# Patient Record
Sex: Male | Born: 1985 | Race: Black or African American | Hispanic: No | Marital: Single | Smoking: Current every day smoker
Health system: Southern US, Community
[De-identification: ages and names within clinical notes are randomized; demographics above are authoritative.]

## PROBLEM LIST (undated history)

## (undated) ENCOUNTER — Emergency Department (HOSPITAL_COMMUNITY): Admission: EM | Payer: Self-pay | Source: Home / Self Care

## (undated) DIAGNOSIS — B20 Human immunodeficiency virus [HIV] disease: Secondary | ICD-10-CM

## (undated) HISTORY — DX: Human immunodeficiency virus (HIV) disease: B20

---
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
## 2013-04-11 ENCOUNTER — Emergency Department (HOSPITAL_COMMUNITY): Payer: Self-pay

## 2013-04-11 ENCOUNTER — Encounter (HOSPITAL_COMMUNITY): Payer: Self-pay | Admitting: Emergency Medicine

## 2013-04-11 ENCOUNTER — Emergency Department (HOSPITAL_COMMUNITY)
Admission: EM | Admit: 2013-04-11 | Discharge: 2013-04-11 | Disposition: A | Discharge status: Home / Self Care | Payer: Self-pay | Attending: Emergency Medicine | Admitting: Emergency Medicine

## 2013-04-11 DIAGNOSIS — F17200 Nicotine dependence, unspecified, uncomplicated: Secondary | ICD-10-CM | POA: Insufficient documentation

## 2013-04-11 DIAGNOSIS — S63509A Unspecified sprain of unspecified wrist, initial encounter: Secondary | ICD-10-CM | POA: Insufficient documentation

## 2013-04-11 DIAGNOSIS — X500XXA Overexertion from strenuous movement or load, initial encounter: Secondary | ICD-10-CM | POA: Insufficient documentation

## 2013-04-11 DIAGNOSIS — Y929 Unspecified place or not applicable: Secondary | ICD-10-CM | POA: Insufficient documentation

## 2013-04-11 DIAGNOSIS — Y939 Activity, unspecified: Secondary | ICD-10-CM | POA: Insufficient documentation

## 2013-04-11 MED ORDER — NAPROXEN 500 MG PO TABS: 500.0000 mg | ORAL_TABLET | Freq: Two times a day (BID) | ORAL | Status: AC

## 2013-04-11 NOTE — ED Provider Notes (Signed)
Medical screening examination/treatment/procedure(s) were performed by non-physician practitioner and as supervising physician I was immediately available for consultation/collaboration.  EKG Interpretation   None         Kenetha Cozza L Vasili Fok, MD 04/11/13 2210 

## 2013-04-11 NOTE — ED Notes (Signed)
Pt states that he was arrested on 9/19 and has been having left wrist pain from handcuffs.

## 2013-04-11 NOTE — ED Provider Notes (Signed)
CSN: [id number]     Arrival date & time 04/11/13  1727 History   First MD Initiated Contact with Patient 04/11/13 1829    This chart was scribed for Iona Coach, a non-physician practitioner working with Benny Lennert, MD by Lewanda Rife, ED Scribe. This patient was seen in room WTR9/WTR9 and the patient's care was started at 7:05 PM     Chief Complaint  Patient presents with  . Wrist Pain    left   (Consider location/radiation/quality/duration/timing/severity/associated sxs/prior Treatment) The history is provided by the patient. No language interpreter was used.   HPI Comments: Jesus Fitzgerald is a 27 y.o. male who presents to the Emergency Department complaining of constant unchanged left wrist pain onset 9/19 since being handcuffed. Reports associated mild swelling. Reports pain is exacerbated when Jesus Fitzgerald plays with his dog. Denies any alleviating factors. Denies associated recent injuries, falls, numbness, and fever.     History reviewed. No pertinent past medical history. History reviewed. No pertinent past surgical history. No family history on file. History  Substance Use Topics  . Smoking status: Current Every Day Smoker    Types: Cigarettes  . Smokeless tobacco: Never Used  . Alcohol Use: Yes     Comment: occasion    Review of Systems  Musculoskeletal: Positive for arthralgias (left wrist pain from hand cuffs).  All other systems reviewed and are negative.   A complete 10 system review of systems was obtained and all systems are negative except as noted in the HPI and PMHx.    Allergies  Review of patient's allergies indicates no known allergies.  Home Medications  No current outpatient prescriptions on file. BP 143/89  Pulse 74  Temp(Src) 98.1 F (36.7 C) (Oral)  Resp 16  SpO2 100% Physical Exam  Nursing note and vitals reviewed. Constitutional: Jesus Fitzgerald is oriented to person, place, and time. Jesus Fitzgerald appears well-developed and well-nourished. No  distress.  HENT:  Head: Normocephalic and atraumatic.  Eyes: EOM are normal.  Neck: Neck supple. No tracheal deviation present.  Cardiovascular: Normal rate, regular rhythm, intact distal pulses and normal pulses.   Pulses:      Radial pulses are 2+ on the right side, and 2+ on the left side.  Pulmonary/Chest: Effort normal. No respiratory distress.  Musculoskeletal: Normal range of motion.  Left wrist: TTP of radial aspect of left wrist    Pain is flexion and extension  Limited ROM secondary to pain    Neurological: Jesus Fitzgerald is alert and oriented to person, place, and time.  Grip strength is normal   Skin: Skin is warm and dry.  Psychiatric: Jesus Fitzgerald has a normal mood and affect. His behavior is normal.    ED Course  Procedures (including critical care time)  COORDINATION OF CARE:  Nursing notes reviewed. Vital signs reviewed. Initial pt interview and examination performed.   7:06 PM-Discussed work up plan with pt at bedside, which includes left wrist x-ray . Pt agrees with plan.  7:07 PM Nursing Notes Reviewed/ Care Coordinated Applicable Imaging Reviewed and incorporated into ED treatment Discussed results and treatment plan with pt. Pt demonstrates understanding and agrees with plan.   Treatment plan initiated:Medications - No data to display   Initial diagnostic testing ordered.    Labs Review Labs Reviewed - No data to display Imaging Review Dg Wrist Complete Left  04/11/2013   CLINICAL DATA:  Wrist pain, injury.  EXAM: LEFT WRIST - COMPLETE 3+ VIEW  COMPARISON:  None  FINDINGS: There is  no evidence of fracture or dislocation. There is no evidence of arthropathy or other focal bone abnormality. Soft tissues are unremarkable.  IMPRESSION: Negative.   Electronically Signed   By: Charlett Nose M.D.   On: 04/11/2013 18:57    EKG Interpretation   None       MDM   1. Wrist sprain and strain, left, initial encounter     Patient's with left wrist pain and swelling after his  dog pulled on his hand 2 days ago. Prior injury on September 19 after Jesus Fitzgerald had handcuffs on the same wrist. Patient has swelling and tenderness over radial aspect of his left wrist. His x-rays negative. Pain with range of motion strength is intact. Jesus Fitzgerald is neurovascularly intact. Suspect a sprain. Will apply a Velcro wrist splint and followup with orthopedics as needed. NSAIDs for pain.  Filed Vitals:   04/11/13 1747 04/11/13 1928  BP: 143/89 153/101  Pulse: 74 75  Temp: 98.1 F (36.7 C) 98.4 F (36.9 C)  TempSrc: Oral Oral  Resp: 16 12  SpO2: 100% 99%    I personally performed the services described in this documentation, which was scribed in my presence. The recorded information has been reviewed and is accurate.    Lottie Mussel, PA-C 04/11/13 1945

## 2014-03-30 IMAGING — CR DG WRIST COMPLETE 3+V*L*
4 series · 4 of 4 positions shown · non-contrast
Comparison: None

CLINICAL DATA: Wrist pain, injury.

EXAM:
LEFT WRIST - COMPLETE 3+ VIEW

[x wrist pa left]
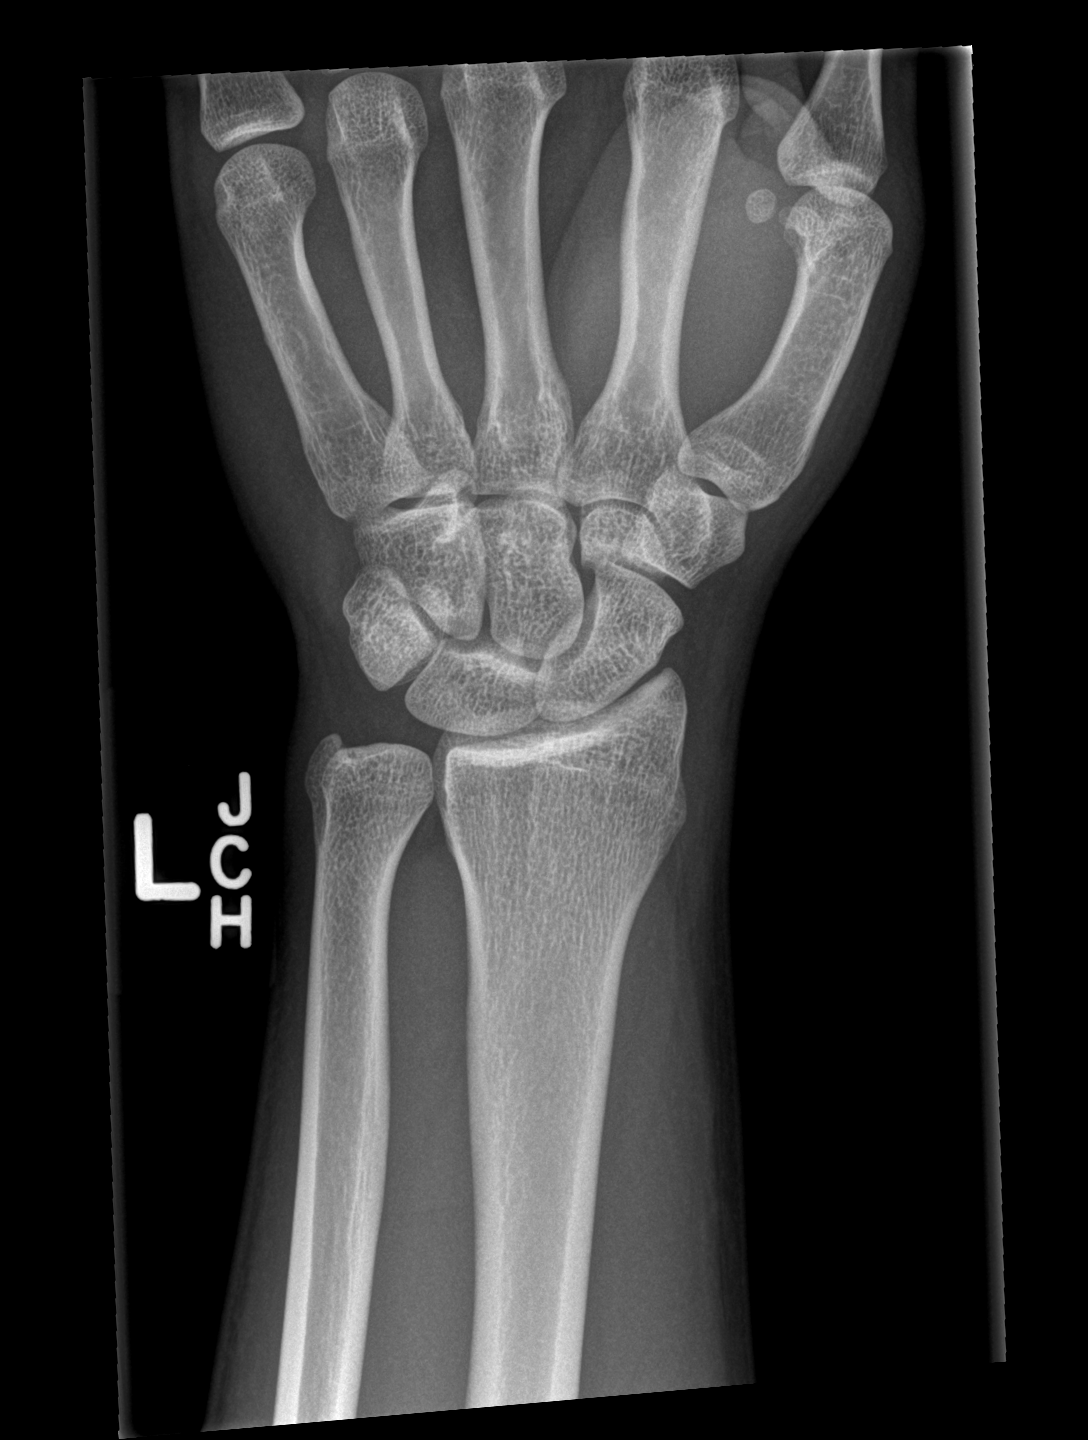

[x wrist obl left]
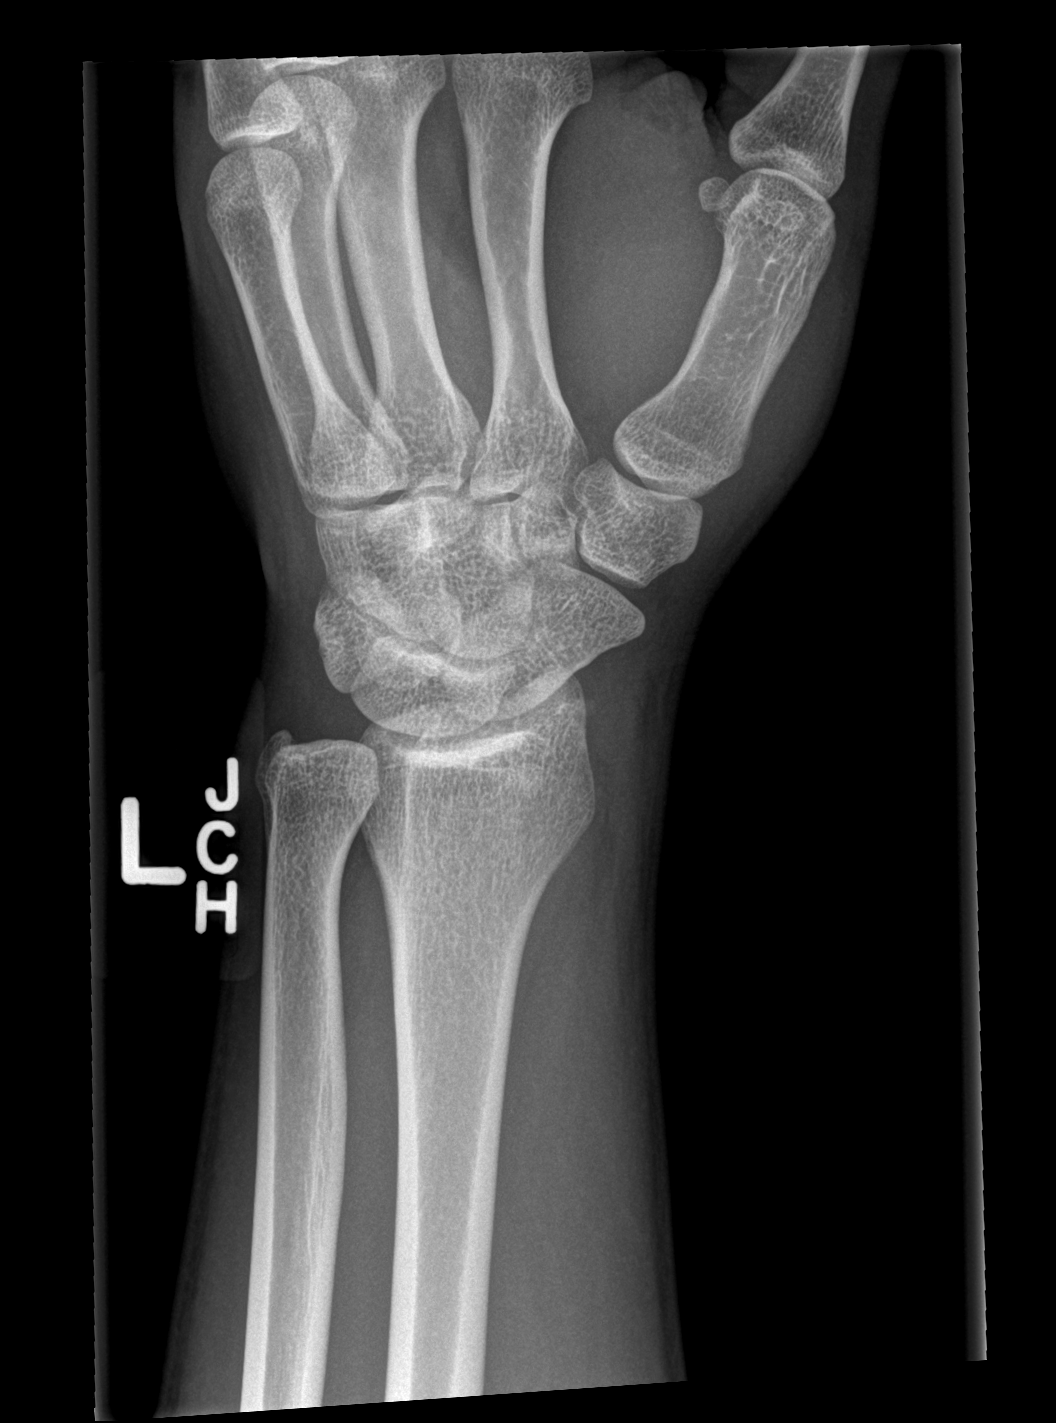

[x wrist lat left]
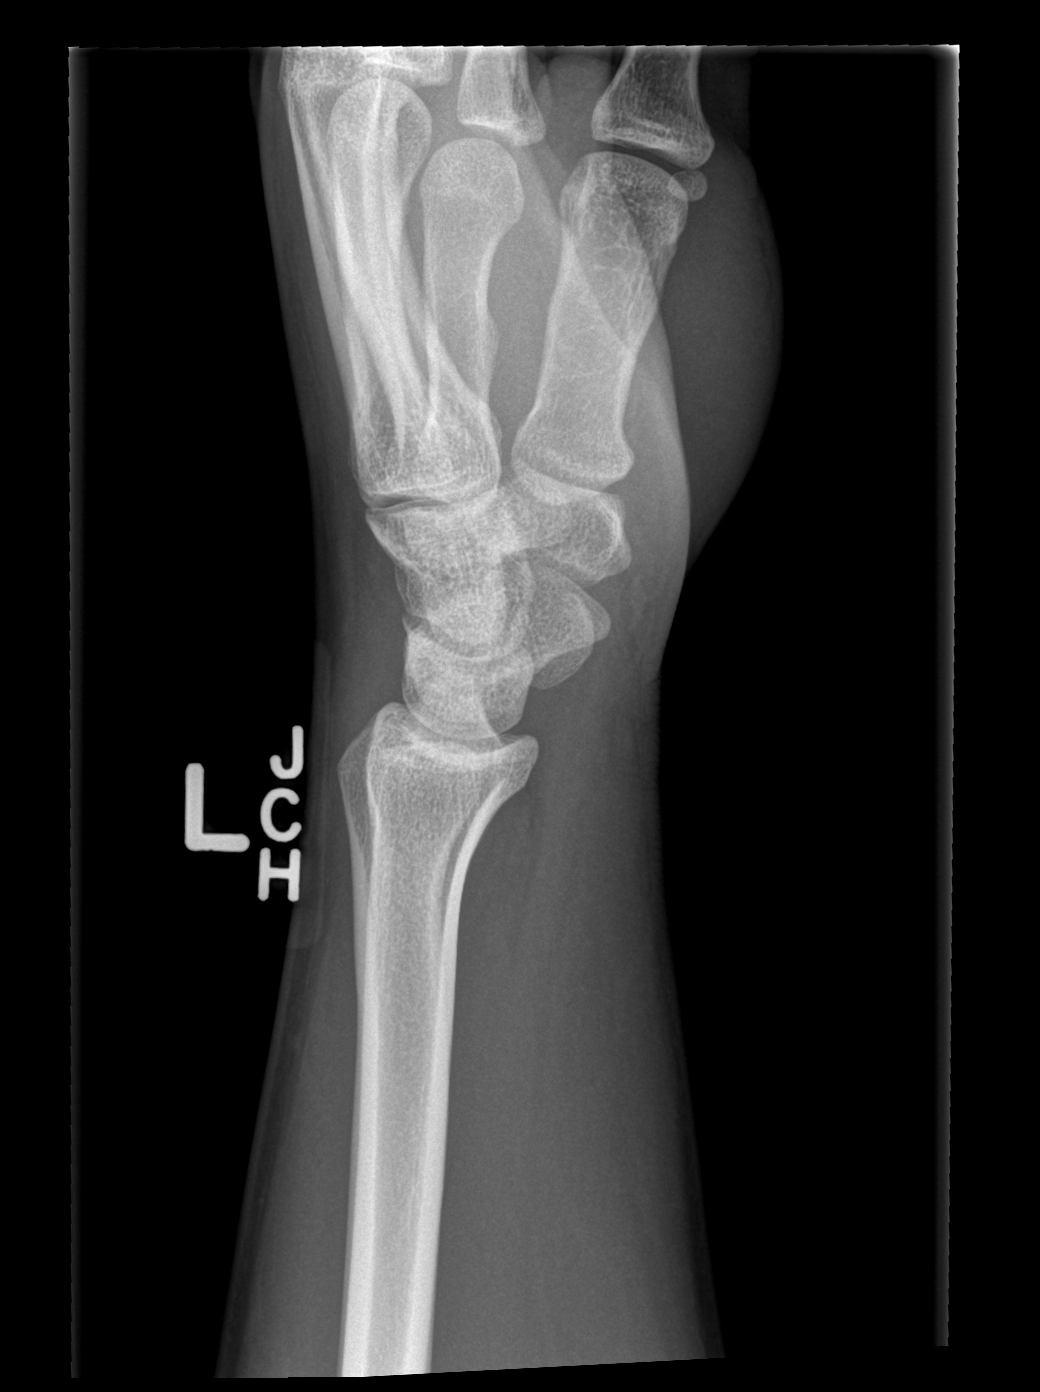

[x wrist navicular view left]
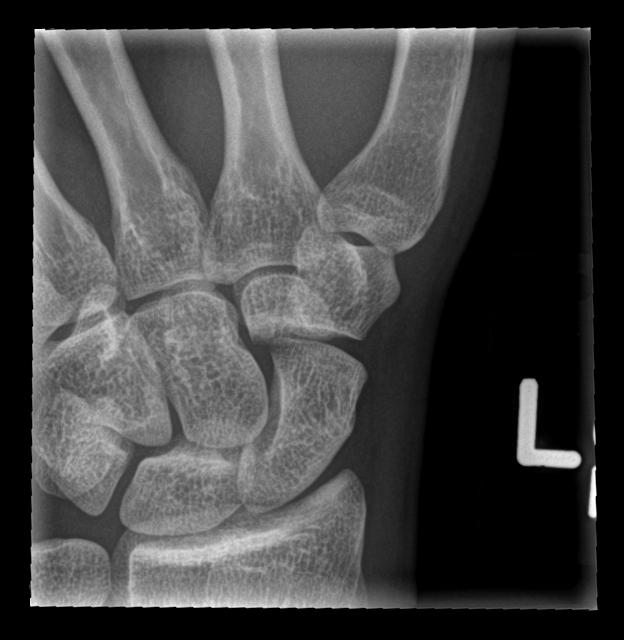

[4 of 4 positions shown; findings below may reference images not displayed]

FINDINGS: There is no evidence of fracture or dislocation. There is no
evidence of arthropathy or other focal bone abnormality. Soft
tissues are unremarkable.
IMPRESSION: Negative.

## 2014-06-04 ENCOUNTER — Encounter (HOSPITAL_COMMUNITY): Payer: Self-pay | Admitting: *Deleted

## 2014-06-04 NOTE — ED Notes (Signed)
Pt reports lower abd pain since yesterday, has pain with urination and back pain. N/v x 1 yesterday.
